# Patient Record
Sex: Male | Born: 2000 | Race: White | Hispanic: No | Marital: Single | Smoking: Never smoker
Health system: Southern US, Community
[De-identification: ages and names within clinical notes are randomized; demographics above are authoritative.]

## PROBLEM LIST (undated history)

## (undated) DIAGNOSIS — J45909 Unspecified asthma, uncomplicated: Secondary | ICD-10-CM

---

## 2019-07-21 ENCOUNTER — Other Ambulatory Visit: Payer: Self-pay | Admitting: Orthopedic Surgery

## 2019-07-21 DIAGNOSIS — M25511 Pain in right shoulder: Secondary | ICD-10-CM

## 2019-07-29 ENCOUNTER — Encounter: Payer: Self-pay | Admitting: Emergency Medicine

## 2019-07-29 ENCOUNTER — Emergency Department
Admission: EM | Admit: 2019-07-29 | Discharge: 2019-07-29 | Disposition: A | Discharge status: Home / Self Care | Payer: Managed Care, Other (non HMO) | Attending: Student in an Organized Health Care Education/Training Program | Admitting: Student in an Organized Health Care Education/Training Program

## 2019-07-29 ENCOUNTER — Other Ambulatory Visit: Payer: Self-pay

## 2019-07-29 ENCOUNTER — Emergency Department: Payer: Managed Care, Other (non HMO)

## 2019-07-29 DIAGNOSIS — U071 COVID-19: Secondary | ICD-10-CM | POA: Insufficient documentation

## 2019-07-29 DIAGNOSIS — Z79899 Other long term (current) drug therapy: Secondary | ICD-10-CM | POA: Diagnosis not present

## 2019-07-29 DIAGNOSIS — J209 Acute bronchitis, unspecified: Secondary | ICD-10-CM

## 2019-07-29 DIAGNOSIS — J45901 Unspecified asthma with (acute) exacerbation: Secondary | ICD-10-CM | POA: Diagnosis not present

## 2019-07-29 DIAGNOSIS — R05 Cough: Secondary | ICD-10-CM | POA: Diagnosis present

## 2019-07-29 HISTORY — DX: Unspecified asthma, uncomplicated: J45.909

## 2019-07-29 MED ORDER — AZITHROMYCIN 250 MG PO TABS: 250.0000 mg | ORAL_TABLET | Freq: Every day | ORAL | 0 refills | Status: AC

## 2019-07-29 MED ORDER — ONDANSETRON 4 MG PO TBDP: ORAL_TABLET | ORAL | 0 refills | Status: DC

## 2019-07-29 MED ORDER — AZITHROMYCIN 500 MG PO TABS
500.0000 mg | ORAL_TABLET | Freq: Once | ORAL | Status: AC
  Administered 2019-07-29: 500 mg via ORAL
  Filled 2019-07-29: qty 1

## 2019-07-29 MED ORDER — PREDNISONE 20 MG PO TABS: ORAL_TABLET | ORAL | 0 refills | Status: DC

## 2019-07-29 MED ORDER — BENZONATATE 100 MG PO CAPS: ORAL_CAPSULE | ORAL | 0 refills | Status: DC

## 2019-07-29 MED ORDER — PREDNISONE 20 MG PO TABS
60.0000 mg | ORAL_TABLET | Freq: Once | ORAL | Status: AC
  Administered 2019-07-29: 60 mg via ORAL
  Filled 2019-07-29: qty 3

## 2019-07-29 NOTE — ED Triage Notes (Signed)
FIRST NURSE NOTE: Pt here COVID + told to come to ED for CXR and steroids.

## 2019-07-29 NOTE — ED Provider Notes (Signed)
[facility] Emergency Department Provider Note ____________________________________________  Time seen: 2208  I have reviewed the triage vital signs and the nursing notes.  HISTORY  Chief Complaint  Cough and Asthma  HPI Jorge Price is a 19 y.o. male presents to the ED for evaluation of symptoms related to recent Covid confirmation.  Patient is a Consulting civil engineer at [employer], and reports his last weekly test was positive.  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  He takes Qvar regularly and albuterol as needed.  He reports generalized fatigue, but denies any change in taste or smell sensation.  He denies any frank chest pain or shortness of breath,  with complaints of a harsh barky cough.   Past Medical History:  Diagnosis Date  . Asthma     There are no problems to display for this patient.   History reviewed. No pertinent surgical history.  Prior to Admission medications   Medication Sig Start Date End Date Taking? Authorizing Provider  albuterol (VENTOLIN HFA) 108 (90 Base) MCG/ACT inhaler Inhale 2 puffs into the lungs every 6 (six) hours as needed for wheezing or shortness of breath.   Yes [provider]  beclomethasone (QVAR) 80 MCG/ACT inhaler Inhale 2 puffs into the lungs 2 (two) times daily.   Yes [provider]  doxycycline (VIBRA-TABS) 100 MG tablet Take 100 mg by mouth 2 (two) times daily.   Yes [provider]    Allergies Patient has no known allergies.  History reviewed. No pertinent family history.  Social History Social History   Tobacco Use  . Smoking status: Never Smoker  . Smokeless tobacco: Never Used  Substance Use Topics  . Alcohol use: Yes  . Drug use: Never    Review of Systems  Constitutional: Negative for fever. Eyes: Negative for visual changes. ENT: Negative for sore throat. Cardiovascular: Negative for chest pain. Respiratory: Negative for shortness of breath.   Reports cough as above. Gastrointestinal: Negative for abdominal pain, vomiting and diarrhea. Genitourinary: Negative for dysuria. Musculoskeletal: Negative for back pain.  Reports generalized body aches. Skin: Negative for rash. Neurological: Negative for headaches, focal weakness or numbness. ____________________________________________  PHYSICAL EXAM:  VITAL SIGNS: ED Triage Vitals  Enc Vitals Group     BP 07/29/19 2109 (!) 154/114     Pulse Rate 07/29/19 2109 100     Resp 07/29/19 2109 18     Temp 07/29/19 2109 99.1 F (37.3 C)     Temp Source 07/29/19 2109 Oral     SpO2 07/29/19 2109 98 %     Weight 07/29/19 2110 150 lb (68 kg)     Height 07/29/19 2110 5\' 10"  (1.778 m)     Head Circumference --      Peak Flow --      Pain Score 07/29/19 2118 5     Pain Loc --      Pain Edu? --      Excl. in GC? --     Constitutional: Alert and oriented. Well appearing and in no distress. Head: Normocephalic and atraumatic. Eyes: Conjunctivae are normal. Normal extraocular movements Cardiovascular: Normal rate, regular rhythm. Normal distal pulses. Respiratory: Normal respiratory effort. No wheezes/rales/rhonchi. Gastrointestinal: Soft and nontender. No distention. Musculoskeletal: Nontender with normal range of motion in all extremities.  Neurologic:  Normal gait without ataxia. Normal speech and language. No gross focal neurologic deficits are appreciated. Skin:  Skin is warm, dry and intact. No rash noted. Psychiatric: Mood and affect are normal.  Patient exhibits appropriate insight and judgment. ____________________________________________   RADIOLOGY  CXR 1V  Negative  I, Anai Lipson V Bacon-Maxi Carreras, personally viewed and evaluated these images (plain radiographs) as part of my medical decision making, as well as reviewing the written report by the radiologist. ____________________________________________  PROCEDURES  Prednisone 60 mg PO Azithromycin 500 mg  PO  Procedures ____________________________________________  INITIAL IMPRESSION / ASSESSMENT AND PLAN / ED COURSE  Patient with ED evaluation of symptoms related to recent Covid confirmation.  Patient reports a barky cough and increased use of his rescue inhaler.  He will be treated for symptomatic Covid with prescription for prednisone taper, and a azithromycin.  He will also be given Tessalon Perles for cough relief.  He is encouraged to follow-up with [facility] for ongoing symptom management, or return to the ED as necessary.  Jorge Price was evaluated in Emergency Department on 07/29/2019 for the symptoms described in the history of present illness. He was evaluated in the context of the global COVID-19 pandemic, which necessitated consideration that the patient might be at risk for infection with the SARS-CoV-2 virus that causes COVID-19. Institutional protocols and algorithms that pertain to the evaluation of patients at risk for COVID-19 are in a state of rapid change based on information released by regulatory bodies including the CDC and federal and state organizations. These policies and algorithms were followed during the patient's care in the ED. ____________________________________________  FINAL CLINICAL IMPRESSION(S) / ED DIAGNOSES  Final diagnoses:  COVID-19 virus infection  Acute bronchitis, unspecified organism      Carmie End, Dannielle Karvonen, PA-C 07/29/19 2234    Merlyn Lot, MD 07/29/19 2241

## 2019-07-29 NOTE — Discharge Instructions (Signed)
Your exam and CXR are stable and normal at this time. Take the prescription meds as directed. Continue to use your inhalers as prescribed. You may take OTC Delsym cough syrup for additional cough relief. Return to the ED as needed.

## 2019-07-29 NOTE — ED Notes (Signed)
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

## 2019-07-29 NOTE — ED Triage Notes (Signed)
Pt presents today with positive COVID test today at [facility]. Students are tested weekly but he tested yesterday but went back again today due to worsening barking cough. Hx of the same annually around this time of year and has "cold induced asthma". Pt was told to come to ED by his PCP and get a chest xray and steroid and oxygen saturation.  Pt alert and calm at this time. Frequent cough present. No acute distress noted.

## 2019-08-01 ENCOUNTER — Ambulatory Visit: Payer: Managed Care, Other (non HMO)

## 2019-08-11 ENCOUNTER — Ambulatory Visit
Admission: RE | Admit: 2019-08-11 | Discharge: 2019-08-11 | Disposition: A | Discharge status: Home / Self Care | Payer: Managed Care, Other (non HMO) | Source: Ambulatory Visit | Attending: Orthopedic Surgery | Admitting: Orthopedic Surgery

## 2019-08-11 ENCOUNTER — Other Ambulatory Visit: Payer: Self-pay

## 2019-08-11 DIAGNOSIS — M25511 Pain in right shoulder: Secondary | ICD-10-CM | POA: Insufficient documentation

## 2019-08-11 MED ORDER — GADOBUTROL 1 MMOL/ML IV SOLN
0.0100 mL | Freq: Once | INTRAVENOUS | Status: AC | PRN
  Administered 2019-08-11: 17:00:00 0.01 mL

## 2019-08-11 MED ORDER — IOHEXOL 180 MG/ML  SOLN
7.0000 mL | Freq: Once | INTRAMUSCULAR | Status: AC | PRN
  Administered 2019-08-11: 7 mL

## 2019-08-11 MED ORDER — LIDOCAINE HCL (PF) 1 % IJ SOLN
7.0000 mL | Freq: Once | INTRAMUSCULAR | Status: AC
  Administered 2019-08-11: 17:00:00 7 mL
  Filled 2019-08-11: qty 8

## 2019-08-11 MED ORDER — SODIUM CHLORIDE (PF) 0.9 % IJ SOLN
15.0000 mL | Freq: Once | INTRAMUSCULAR | Status: AC
  Administered 2019-08-11: 15 mL

## 2020-07-09 ENCOUNTER — Other Ambulatory Visit (HOSPITAL_COMMUNITY): Payer: Self-pay | Admitting: Urology

## 2020-07-12 ENCOUNTER — Other Ambulatory Visit: Payer: Self-pay | Admitting: Sports Medicine

## 2020-07-12 ENCOUNTER — Other Ambulatory Visit: Payer: Self-pay

## 2020-07-12 DIAGNOSIS — M542 Cervicalgia: Secondary | ICD-10-CM

## 2020-07-12 DIAGNOSIS — M5412 Radiculopathy, cervical region: Secondary | ICD-10-CM

## 2020-07-12 DIAGNOSIS — M79601 Pain in right arm: Secondary | ICD-10-CM

## 2020-07-18 ENCOUNTER — Ambulatory Visit: Payer: Managed Care, Other (non HMO)

## 2020-07-27 ENCOUNTER — Ambulatory Visit: Payer: Managed Care, Other (non HMO)

## 2021-10-23 IMAGING — MR MR SHOULDER*R* W/CM
6 series · 40 of 40 positions shown · IV contrast (agent unspecified)
Comparison: None.

CLINICAL DATA: Shoulder pain, started 05/02/2019

EXAM:
MR ARTHROGRAM OF THE right SHOULDER
TECHNIQUE: Multiplanar, multisequence MR imaging of the right shoulder was
performed following the administration of intra-articular contrast.
CONTRAST:  See Injection Documentation.

[Series 5: T1 fat-sat · axial · right · 4.0mm · 0.55mm/px · z∈[-125,-13]mm · 6 of 25 slices shown (1 of 3)]
[im 1/25]
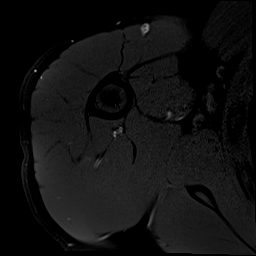
[im 5/25]
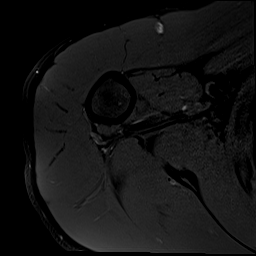
[im 10/25]
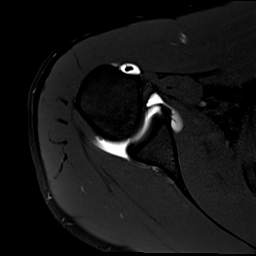
[im 15/25]
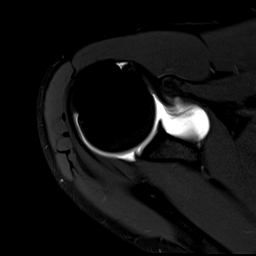
[im 20/25]
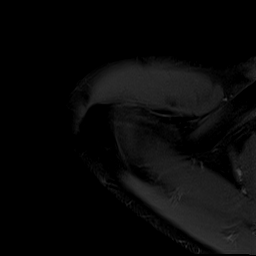
[im 25/25]
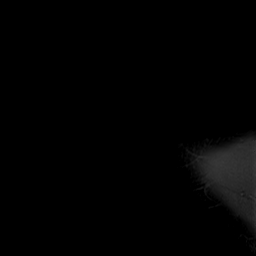

[Series 7: T1 fat-sat · oblique · right · 4.0mm · 0.55mm/px · 6 of 25 slices shown (2 of 3)]
[im 1/25]
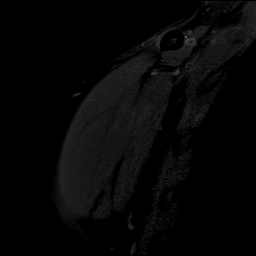
[im 5/25]
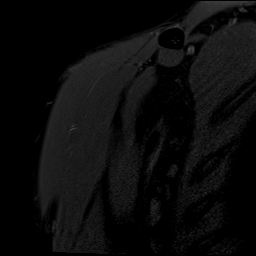
[im 10/25]
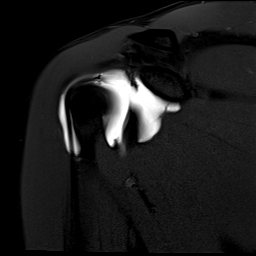
[im 15/25]
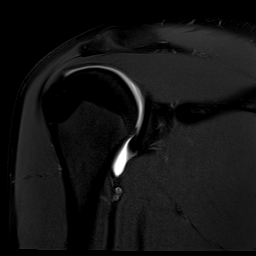
[im 20/25]
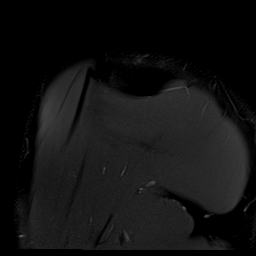
[im 25/25]
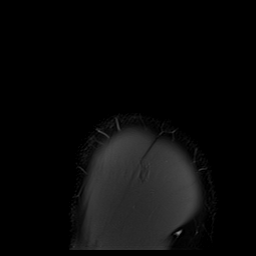

[Series 8: T2 fat-sat · oblique · right · 4.0mm · 0.55mm/px · 7 of 26 slices shown (1 of 2)]
[im 1/26]
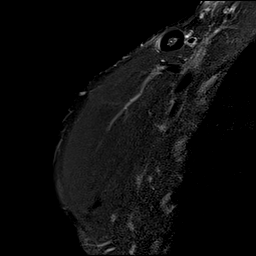
[im 5/26]
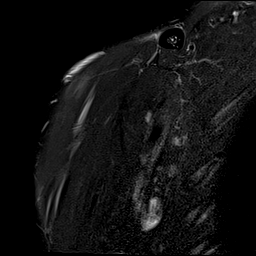
[im 9/26]
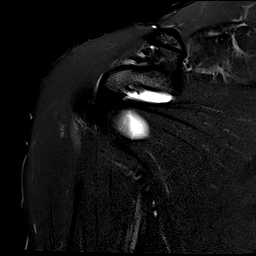
[im 13/26]
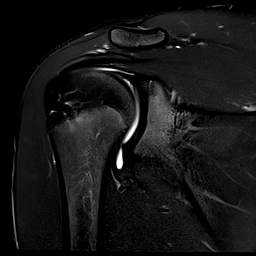
[im 17/26]
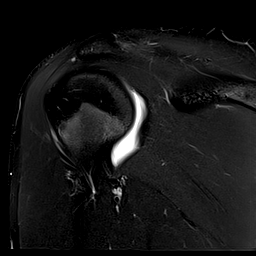
[im 21/26]
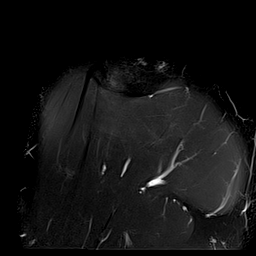
[im 26/26]
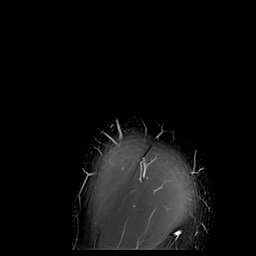

[Series 9: T1 · oblique · right · 4.0mm · 0.51mm/px · 7 of 26 slices shown]
[im 1/26]
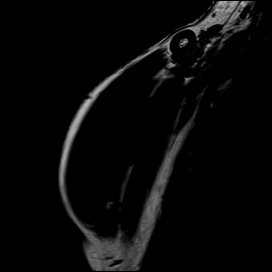
[im 5/26]
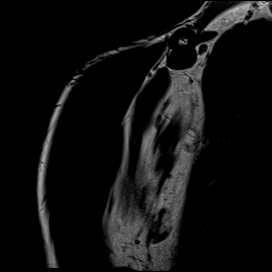
[im 9/26]
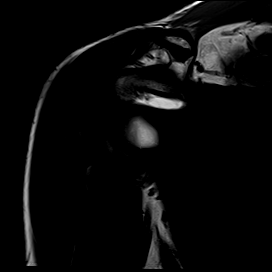
[im 13/26]
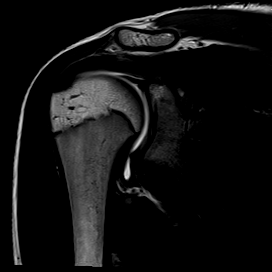
[im 17/26]
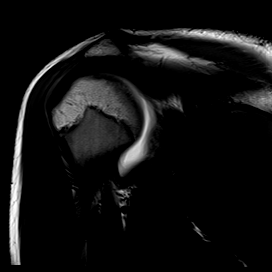
[im 21/26]
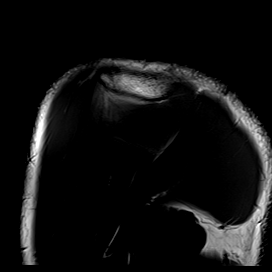
[im 26/26]
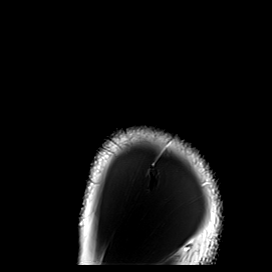

[Series 10: T2 fat-sat · oblique · right · 4.0mm · 0.55mm/px · 7 of 25 slices shown (2 of 2)]
[im 1/25]
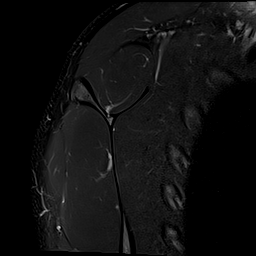
[im 5/25]
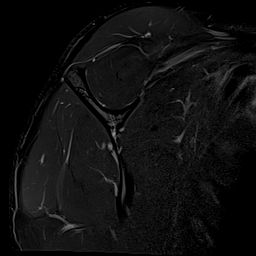
[im 9/25]
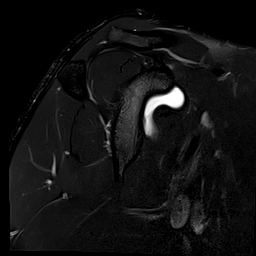
[im 13/25]
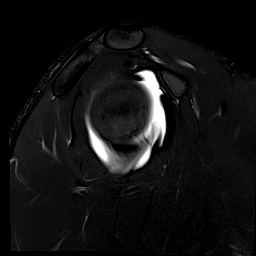
[im 17/25]
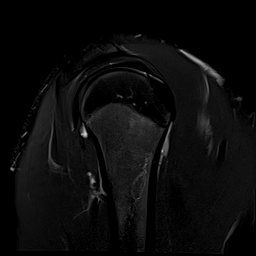
[im 21/25]
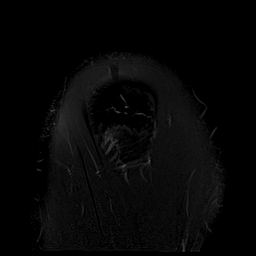
[im 25/25]
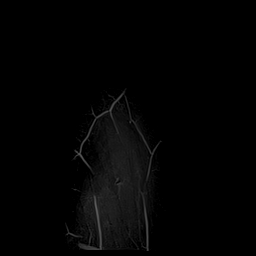

[Series 12: T1 fat-sat · sagittal · right · 4.0mm · 0.62mm/px · 7 of 26 slices shown (3 of 3)]
[im 1/26]
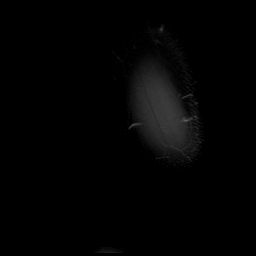
[im 5/26]
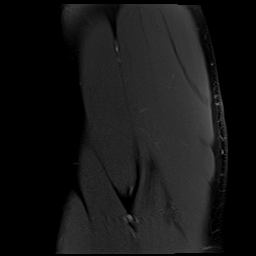
[im 9/26]
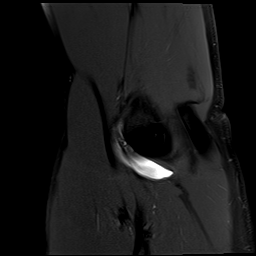
[im 13/26]
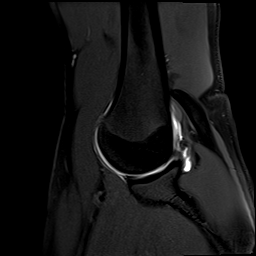
[im 17/26]
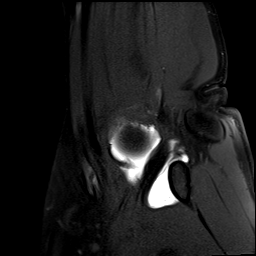
[im 21/26]
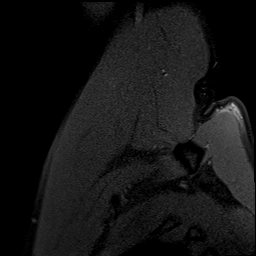
[im 26/26]
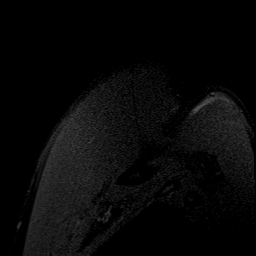

[40 of 40 positions shown; findings below may reference images not displayed]

FINDINGS: Rotator cuff: The supraspinatus and infraspinatus tendons are intact
without evidence of tendinopathy or tear. The subscapularis, and
teres minor tendons are intact . The muscles of the rotator cuff are
normal without tear, edema, or atrophy.

Muscles: The muscles other than the rotator cuff are normal without
tear, edema, or atrophy.

Biceps Long Head: The Intraarticular and extraarticular portions of
the biceps tendon are normal in position, size and signal.

Acromioclavicular Joint: The acromioclavicular joint is intact. Type
II acromion.

Glenohumeral Joint: The glenohumeral joint alignment is well
maintained. The humeral head and glenoid articular cartilage are
without focal defect or significant thinning. No joint effusion.

Labrum: The glenoid labrum is grossly intact without evidence large
tear or detachment. However the evaluation is limited by lack of
intraarticular fluid.

Bones: No fracture, osteonecrosis, or pathologic marrow
infiltration.

Other: The subacromial-subdeltoid bursa is normal without evidence
of bursitis.
IMPRESSION: Normal MRI of the shoulder

## 2021-10-23 IMAGING — RF DG FLUORO GUIDE NDL PLC/BX
3 series · 8 of 8 positions shown · non-contrast
Comparison: none

CLINICAL DATA: Right shoulder pain 4 months

EXAM:
RIGHT SHOULDER INJECTION UNDER FLUOROSCOPY
TECHNIQUE: After a thorough discussion of risks and benefits of the procedure,
written and oral informed consent was obtained.

[Series 1: cp_standard · 0.18mm/px · 4 of 22 frames shown (1 of 3)]
[frame 4/22]
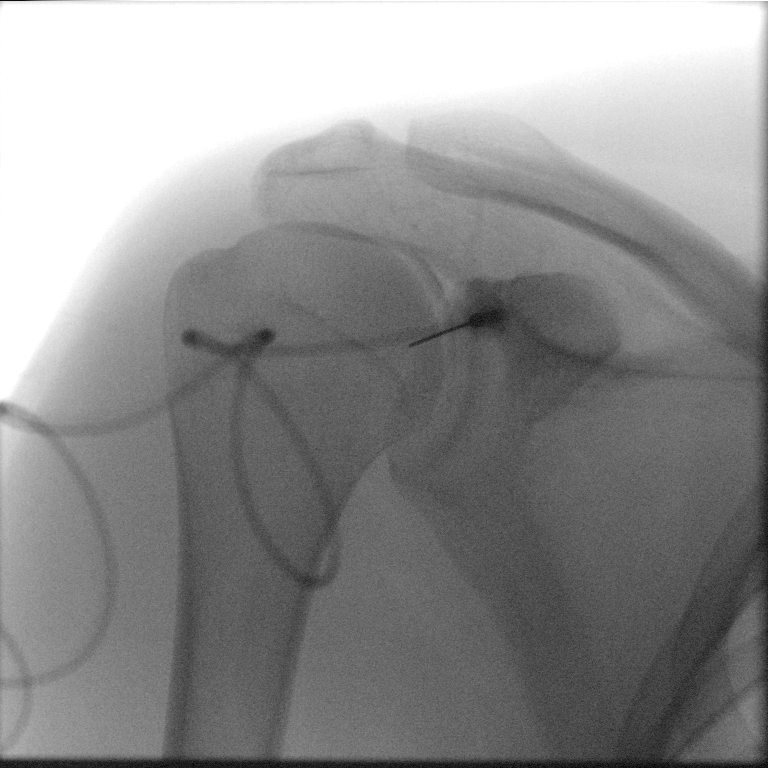
[frame 12/22]
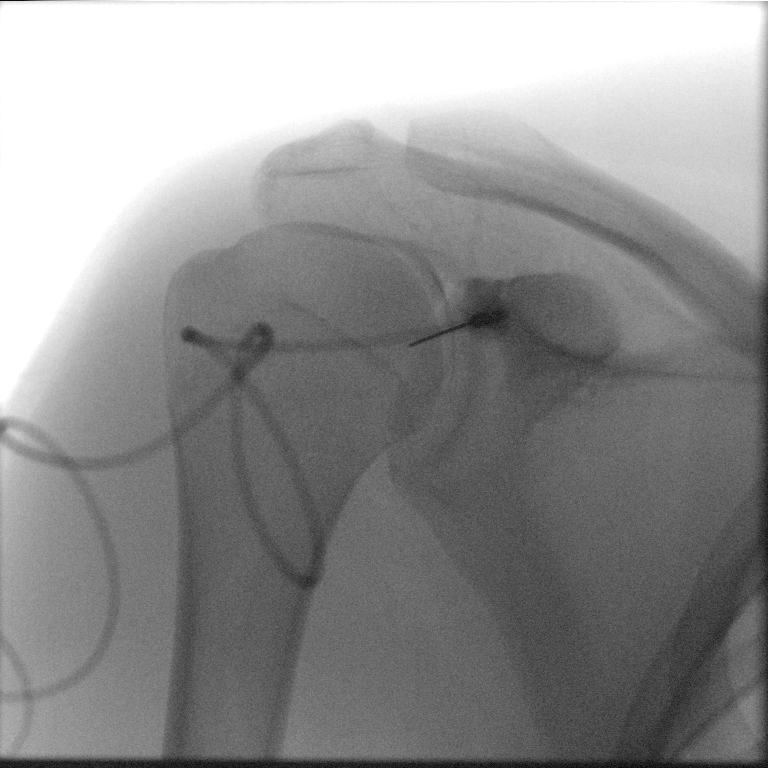
[frame 18/22]
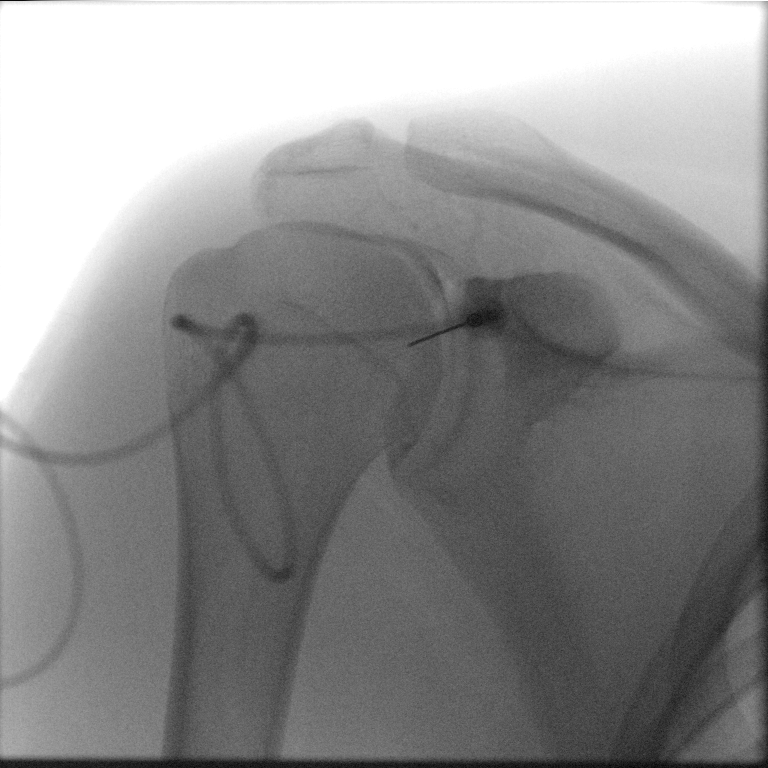
[frame 19/22]
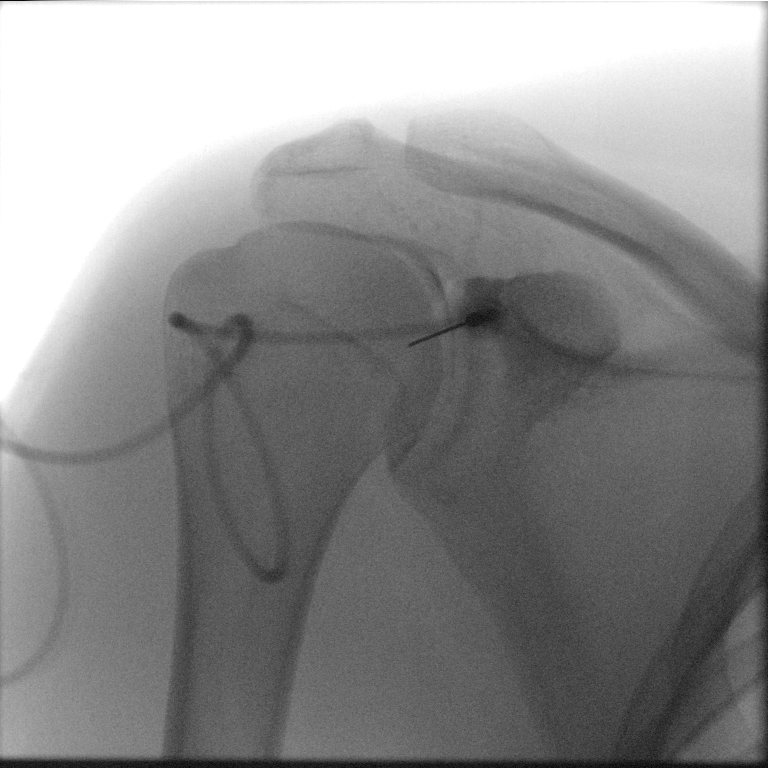

[Series 2: cp_standard · 0.18mm/px · 1 of 1 slices shown (2 of 3)]
[im 1/1]
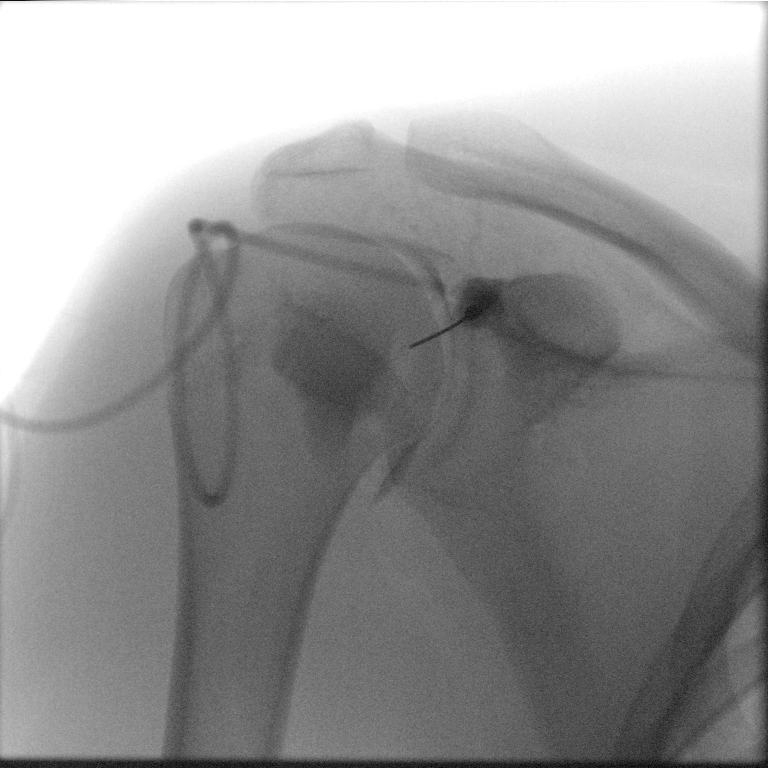

[Series 3: cp_standard · 0.18mm/px · 3 of 7 frames shown (3 of 3)]
[frame 2/7]
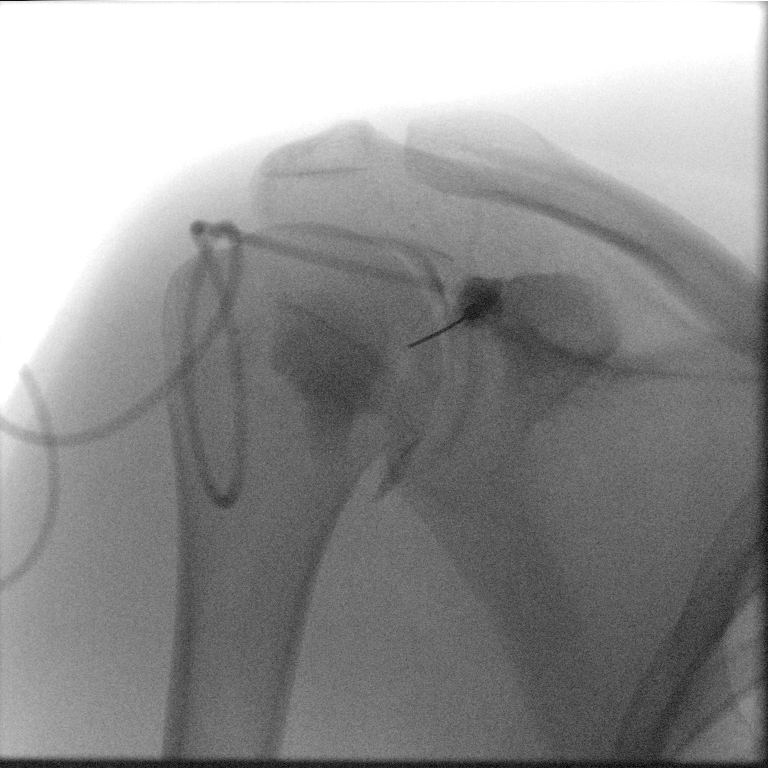
[frame 4/7]
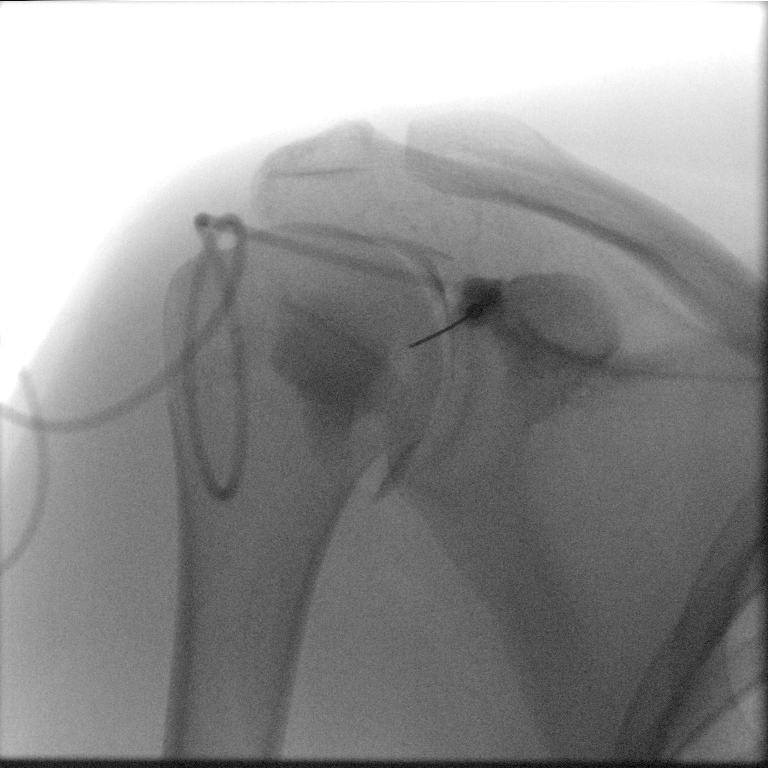
[frame 6/7]
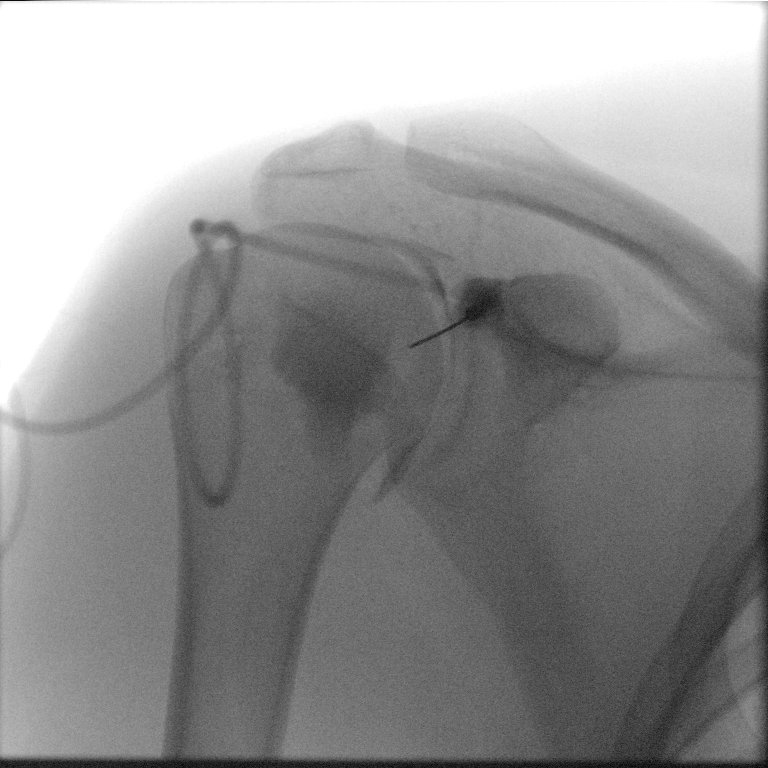

[8 of 8 positions shown; findings below may reference images not displayed]

Preliminary localization was performed over the right shoulder. The
area was marked over superior medial anterior humeral head.

After prep and drape in the usual sterile fashion, the skin and
deeper subcutaneous tissues were anesthetized with 1% Lidocaine
without Epinephrine. Under fluoroscopic guidance, a 22 gauge
inch spinal needle was advanced into the joint over the superior
medial anterior humeral head using an anterior approach.
Intra-articular injection of Lidocaine was performed which flowed
freely and subsequently the joint was distended with 10 ml of a
[DATE] dilution of Gadavist contrast. The MR arthrogram solution was
as follows: 7 mL Mmnipaque-2DA contrast agent, 0.1 mL Gadavist, 13
mL sterile saline. An end point was felt and the injection was
discontinued, the needle removed, and a sterile dressing applied.
The patient was taken to MRI for subsequent imaging.

The patient tolerated the procedure well and there were no
complications.

FLUOROSCOPY TIME:  Fluoroscopy Time:  12 seconds

Radiation Exposure Index (if provided by the fluoroscopic device):
1.2 mGy

Number of Acquired Spot Images: 0 full exposures
IMPRESSION: Technically successful right shoulder injection for MRI.

## 2022-02-08 ENCOUNTER — Ambulatory Visit: Payer: Managed Care, Other (non HMO) | Admitting: Medical

## 2022-05-14 ENCOUNTER — Other Ambulatory Visit: Payer: Self-pay

## 2022-05-14 ENCOUNTER — Ambulatory Visit (INDEPENDENT_AMBULATORY_CARE_PROVIDER_SITE_OTHER): Payer: Managed Care, Other (non HMO) | Admitting: Family

## 2022-05-14 VITALS — BP 122/74 | HR 83 | Temp 99.6°F | Wt 167.0 lb

## 2022-05-14 DIAGNOSIS — J0300 Acute streptococcal tonsillitis, unspecified: Secondary | ICD-10-CM | POA: Diagnosis not present

## 2022-05-14 DIAGNOSIS — J039 Acute tonsillitis, unspecified: Secondary | ICD-10-CM | POA: Diagnosis not present

## 2022-05-14 LAB — POC SOFIA 2 FLU + SARS ANTIGEN FIA
Influenza A, POC: NEGATIVE
Influenza B, POC: NEGATIVE
SARS Coronavirus 2 Ag: NEGATIVE

## 2022-05-14 LAB — POCT RAPID STREP A (OFFICE): Rapid Strep A Screen: POSITIVE — AB

## 2022-05-14 MED ORDER — PENICILLIN V POTASSIUM 500 MG PO TABS: 500.0000 mg | ORAL_TABLET | Freq: Three times a day (TID) | ORAL | 0 refills | Status: AC

## 2022-05-14 NOTE — Progress Notes (Signed)
[facility] [address] [place], [place] [address] Phone: [phone] Fax: [phone]   Office Visit Note  Patient Name: Jorge Price  Date of Birth:Oct 10, 2000  Med Rec number [id number]  Date of Service: 05/14/2022  Patient has no known allergies.  Chief Complaint  Patient presents with   Sick     HPI Pt presents with c/o fever that started yesterday.  Otc Tylenol with some help.  Slept most of yesterday.  Bad sore throat.  Mild cough.  Hx of asthma.    Current Medication:  Outpatient Encounter Medications as of 05/14/2022  Medication Sig   penicillin v potassium (VEETID) 500 MG tablet Take 1 tablet (500 mg total) by mouth 3 (three) times daily for 10 days.   albuterol (VENTOLIN HFA) 108 (90 Base) MCG/ACT inhaler Inhale 2 puffs into the lungs every 6 (six) hours as needed for wheezing or shortness of breath.   [DISCONTINUED] beclomethasone (QVAR) 80 MCG/ACT inhaler Inhale 2 puffs into the lungs 2 (two) times daily.   [DISCONTINUED] benzonatate (TESSALON PERLES) 100 MG capsule Take 1-2 tabs TID prn cough   [DISCONTINUED] doxycycline (VIBRA-TABS) 100 MG tablet Take 100 mg by mouth 2 (two) times daily.   [DISCONTINUED] ondansetron (ZOFRAN ODT) 4 MG disintegrating tablet Allow 1-2 tablets to dissolve in your mouth every 8 hours as needed for nausea/vomiting   [DISCONTINUED] predniSONE (DELTASONE) 20 MG tablet Take 3 tabs daily x 2 days; Take 2 tabs daily x 3 days; Take 1 tab daily x 3 days; Take 0.5 tab daily x 4 days   No facility-administered encounter medications on file as of 05/14/2022.      Medical History: Past Medical History:  Diagnosis Date   Asthma      Vital Signs: BP 122/74   Pulse 83   Temp 99.6 F (37.6 C)   Wt 167 lb (75.8 kg)   SpO2 98%   BMI 23.96 kg/m    Review of Systems  Constitutional:  Positive for activity change, chills, fatigue and fever.  HENT:  Positive for sore throat. Negative for ear pain, sinus pressure and sinus  pain.   Respiratory: Negative.      Physical Exam Constitutional:      Appearance: Normal appearance.  HENT:     Right Ear: Tympanic membrane normal.     Left Ear: Tympanic membrane normal.     Nose: Nose normal.     Mouth/Throat:     Pharynx: Oropharyngeal exudate and posterior oropharyngeal erythema present.     Comments: Moderate swelling to pharynx and tonsils, no uvular deviation Cardiovascular:     Rate and Rhythm: Normal rate and regular rhythm.     Heart sounds: Normal heart sounds.  Pulmonary:     Effort: Pulmonary effort is normal.     Breath sounds: Normal breath sounds.  Lymphadenopathy:     Cervical: Cervical adenopathy present.  Neurological:     Mental Status: He is alert.    Results for orders placed or performed in visit on 05/14/22 (from the past 24 hour(s))  POC  2 FLU + SARS ANTIGEN FIA     Status: Normal   Collection Time: 05/14/22 11:33 AM  Result Value Ref Range   Influenza A, POC Negative Negative   Influenza B, POC Negative Negative   SARS Coronavirus 2 Ag Negative Negative  POCT rapid strep A     Status: Abnormal   Collection Time: 05/14/22 11:38 AM  Result Value Ref Range   Rapid Strep  A Screen Positive (A) Negative       Assessment/Plan: 1. Tonsillitis  - POCT rapid strep A - penicillin v potassium (VEETID) 500 MG tablet; Take 1 tablet (500 mg total) by mouth 3 (three) times daily for 10 days.  Dispense: 30 tablet; Refill: 0 - POC  2 FLU + SARS ANTIGEN FIA  Hydrate and rest. Salt water gargles. Otc Advil and Tylenol as needed for comfort.  Take all 10 days of abx.  Follow up with pcp if no improvement in 5-7 days.  General Counseling: Jorge Price verbalizes understanding of the findings of todays visit and agrees with plan of treatment. I have discussed any further diagnostic evaluation that may be needed or ordered today. We also reviewed his medications today. he has been encouraged to call the office with any questions or concerns that  should arise related to todays visit.   Orders Placed This Encounter  Procedures   POCT rapid strep A   POC  2 FLU + SARS ANTIGEN FIA    Meds ordered this encounter  Medications   penicillin v potassium (VEETID) 500 MG tablet    Sig: Take 1 tablet (500 mg total) by mouth 3 (three) times daily for 10 days.    Dispense:  30 tablet    Refill:  0    Time spent: 20 Minutes Time spent includes review of chart, medications, test results, and follow up plan with the patient.    Janeece Agee, FNP-C Nurse Practitioner

## 2022-07-09 ENCOUNTER — Encounter: Payer: Self-pay | Admitting: Oncology

## 2022-07-09 ENCOUNTER — Other Ambulatory Visit: Payer: Self-pay

## 2022-07-09 ENCOUNTER — Ambulatory Visit (INDEPENDENT_AMBULATORY_CARE_PROVIDER_SITE_OTHER): Payer: Managed Care, Other (non HMO) | Admitting: Oncology

## 2022-07-09 VITALS — BP 136/84 | HR 117 | Temp 99.9°F | Wt 166.0 lb

## 2022-07-09 DIAGNOSIS — R112 Nausea with vomiting, unspecified: Secondary | ICD-10-CM

## 2022-07-09 DIAGNOSIS — R52 Pain, unspecified: Secondary | ICD-10-CM

## 2022-07-09 LAB — POC SOFIA 2 FLU + SARS ANTIGEN FIA
Influenza A, POC: NEGATIVE
Influenza B, POC: NEGATIVE
SARS Coronavirus 2 Ag: NEGATIVE

## 2022-07-09 MED ORDER — ONDANSETRON HCL 4 MG PO TABS: 4.0000 mg | ORAL_TABLET | Freq: Three times a day (TID) | ORAL | 0 refills | Status: DC | PRN

## 2022-07-09 MED ORDER — OSELTAMIVIR PHOSPHATE 75 MG PO CAPS: 75.0000 mg | ORAL_CAPSULE | Freq: Two times a day (BID) | ORAL | 0 refills | Status: DC

## 2022-07-09 NOTE — Progress Notes (Signed)
[address] [place], [place] [address] Phone: [phone] Fax: [phone]   Office Visit Note  Patient Name: Jorge Price  Date of EQAST:[id number]  Med Rec number [id number]  Date of Service: 07/09/2022  Patient has no known allergies.  Chief Complaint  Patient presents with   Cough   Emesis   Diarrhea   Headache   Chills   Generalized Body Aches   Patient is an 22 y.o. student here for complaints of nausea, headache, cough and diarrhea that started yesterday. Felt fine yesterday for most of the day. Wet surfing. Has a history anxiety. Has been unable to eat or drink. Had dinner last night. Has not been taking anything otc.   Has history of cold induced asthma.  Has an inhaler to use as needed.  Has not used this yet.  Current Medication:  Outpatient Encounter Medications as of 07/09/2022  Medication Sig   albuterol (VENTOLIN HFA) 108 (90 Base) MCG/ACT inhaler Inhale 2 puffs into the lungs every 6 (six) hours as needed for wheezing or shortness of breath.   sertraline (ZOLOFT) 50 MG tablet Take by mouth.   No facility-administered encounter medications on file as of 07/09/2022.      Medical History: Past Medical History:  Diagnosis Date   Asthma      Vital Signs: BP 136/84 (BP Location: Left Arm, Patient Position: Sitting)   Pulse (!) 117   Temp 99.9 F (37.7 C)   Wt 166 lb (75.3 kg)   SpO2 96%   BMI 23.82 kg/m   ROS: As per HPI.  All other pertinent ROS negative.     Review of Systems  Constitutional:  Positive for fatigue.  HENT:  Positive for congestion.   Respiratory:  Positive for cough.   Gastrointestinal:  Positive for nausea and vomiting. Negative for abdominal pain.  Musculoskeletal:  Positive for myalgias.  Neurological:  Negative for dizziness and headaches.    Physical Exam Constitutional:      Appearance: Normal appearance. He is well-developed.  HENT:     Right Ear: Tympanic membrane normal.     Left Ear:  Tympanic membrane normal.     Nose: No congestion.     Right Turbinates: Not swollen.     Left Turbinates: Not swollen.     Mouth/Throat:     Mouth: Mucous membranes are moist.     Pharynx: Pharyngeal swelling and posterior oropharyngeal erythema present.     Tonsils: No tonsillar exudate. 1+ on the right. 1+ on the left.  Pulmonary:     Effort: Pulmonary effort is normal.     Breath sounds: Normal breath sounds.  Lymphadenopathy:     Cervical: Cervical adenopathy present.  Neurological:     Mental Status: He is alert.     No results found for this or any previous visit (from the past 24 hour(s)).  Assessment/Plan: 1. Generalized body aches - Flu/Covid negative. -Discussed likely too early for positive flu test. Clinically suspicious for flu.  Discussed Potential benefit and adverse effects of Tamiflu.  Patient elected to take treatment.  Use inhaler as needed for cough.  Encourage supportive measures (fluids, rest cough drops, tea) and over the counter meds (I.e. analgesic/anti-inflammatory, decongestant, antihistamine, cough suppressant)  as needed for symptom relief.  Discussed infection control measures to prevent spread of infection.  Advised she should not return to class until afebrile (100.4 or above) for 24 hours.  Advised patient to send me a message in MyChart if  continue to have fever.  Return to clinic as needed for new/worsening symptoms (I.e. shortness of breath) or if symptoms are not resolving as expected over the next 5-7 days.   - POC SOFIA 2 FLU + SARS ANTIGEN FIA - oseltamivir (TAMIFLU) 75 MG capsule; Take 1 capsule (75 mg total) by mouth 2 (two) times daily.  Dispense: 10 capsule; Refill: 0  2. Nausea and vomiting, unspecified vomiting type -Has had Zofran in the past.  Recommend Zofran 15 minutes prior to trying to eat or take medication.  May take every 4-6 hours as needed for nausea and vomiting. - ondansetron (ZOFRAN) 4 MG tablet; Take 1 tablet (4 mg total) by  mouth every 8 (eight) hours as needed for nausea or vomiting.  Dispense: 20 tablet; Refill: 0   Disposition-return to clinic as needed  General Counseling: Osamah verbalizes understanding of the findings of todays visit and agrees with plan of treatment. I have discussed any further diagnostic evaluation that may be needed or ordered today. We also reviewed his medications today. he has been encouraged to call the office with any questions or concerns that should arise related to todays visit.   Orders Placed This Encounter  Procedures   POC SOFIA 2 FLU + SARS ANTIGEN FIA    No orders of the defined types were placed in this encounter.  I spent 20 minutes dedicated to the care of this patient (face-to-face and non-face-to-face) on the date of the encounter to include what is described in the assessment and plan.   Faythe Casa, NP 07/09/2022 11:48 AM

## 2022-07-27 ENCOUNTER — Encounter: Payer: Self-pay | Admitting: Medical

## 2022-07-27 ENCOUNTER — Ambulatory Visit: Payer: Managed Care, Other (non HMO) | Admitting: Medical

## 2022-07-27 ENCOUNTER — Other Ambulatory Visit: Payer: Self-pay

## 2022-07-27 VITALS — BP 110/68 | HR 108 | Temp 100.4°F | Ht 75.98 in | Wt 166.0 lb

## 2022-07-27 DIAGNOSIS — R509 Fever, unspecified: Secondary | ICD-10-CM | POA: Diagnosis not present

## 2022-07-27 DIAGNOSIS — R110 Nausea: Secondary | ICD-10-CM | POA: Diagnosis not present

## 2022-07-27 DIAGNOSIS — J039 Acute tonsillitis, unspecified: Secondary | ICD-10-CM | POA: Diagnosis not present

## 2022-07-27 LAB — POC SOFIA 2 FLU + SARS ANTIGEN FIA
Influenza A, POC: NEGATIVE
Influenza B, POC: NEGATIVE
SARS Coronavirus 2 Ag: NEGATIVE

## 2022-07-27 LAB — POCT RAPID STREP A (OFFICE): Rapid Strep A Screen: NEGATIVE

## 2022-07-27 MED ORDER — ONDANSETRON HCL 4 MG PO TABS: 4.0000 mg | ORAL_TABLET | Freq: Three times a day (TID) | ORAL | 0 refills | Status: AC | PRN

## 2022-07-27 NOTE — Progress Notes (Signed)
[facility] [place], [place] [address] Phone: [phone] Fax: [phone]   Office Visit Note  Patient Name: Jorge Price  Date of [id number]  Med Rec number [id number]  Date of Service: 07/27/2022  Allergies: Patient has no known allergies.  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   Sx began 2 days ago with nasal congestion and sore throat. Sx got worse yesterday. Has gotten more painful to swallow. Has had nausea and dry heaving. Not much cough. Subjective fever sx yesterday afternoon, temp 102 last night. Has runny nose, myalgias and HA. Some diarrhea last couple weeks.  Denies sick contacts. Seen 2.5 weeks ago with some flu like sx, negative for flu and COVID, given Tamiflu. Then given 6d of Prednisone when cough got worse, finished this about 10 days ago. Had strep in Dec. No hx of mono.  Nyquil last night, no meds today.    Current Medication:  Outpatient Encounter Medications as of 07/27/2022  Medication Sig   albuterol (VENTOLIN HFA) 108 (90 Base) MCG/ACT inhaler Inhale 2 puffs into the lungs every 6 (six) hours as needed for wheezing or shortness of breath.   ondansetron (ZOFRAN) 4 MG tablet Take 1 tablet (4 mg total) by mouth every 8 (eight) hours as needed for nausea or vomiting.   sertraline (ZOLOFT) 50 MG tablet Take by mouth.   [DISCONTINUED] oseltamivir (TAMIFLU) 75 MG capsule Take 1 capsule (75 mg total) by mouth 2 (two) times daily.   No facility-administered encounter medications on file as of 07/27/2022.      Medical History: Past Medical History:  Diagnosis Date   Asthma      Vital Signs: BP 110/68   Pulse (!) 108   Temp (!) 100.4 F (38 C) (Tympanic)   Ht 6' 3.98" (1.93 m)   Wt 166 lb (75.3 kg)   SpO2 98%   BMI 20.21 kg/m    Review of Systems See HPI  Physical Exam Vitals reviewed.  Constitutional:      General: He is  not in acute distress.    Appearance: He is ill-appearing (moderately).  HENT:     Head: Normocephalic.     Right Ear: Tympanic membrane, ear canal and external ear normal.     Left Ear: Tympanic membrane, ear canal and external ear normal.     Nose: Mucosal edema, congestion (moderate) and rhinorrhea present. Rhinorrhea is clear.     Right Turbinates: Swollen.     Left Turbinates: Swollen.     Mouth/Throat:     Mouth: Mucous membranes are moist. No oral lesions.     Pharynx: Uvula midline. Posterior oropharyngeal erythema (mild) present. No pharyngeal swelling or uvula swelling.     Tonsils: Tonsillar exudate (moderate white) present. 2+ on the right. 2+ on the left.  Cardiovascular:     Rate and Rhythm: Regular rhythm. Tachycardia present.     Heart sounds: No murmur heard.    No friction rub. No gallop.  Pulmonary:     Effort: Pulmonary effort is normal.     Breath sounds: Normal breath sounds. No wheezing, rhonchi or rales.  Musculoskeletal:     Cervical back: Neck supple. No rigidity.  Lymphadenopathy:     Cervical: Cervical adenopathy (1+ anterior and posterior cervical nodes, tender) present.  Neurological:     Mental Status: He is alert.    POCT rapid strep A  Status: Normal   Collection Time: 07/27/22 11:13 AM  Result Value Ref Range   Rapid Strep A Screen Negative Negative   POC SOFIA 2 FLU + SARS ANTIGEN FIA     Status: Normal   Collection Time: 07/27/22 11:23 AM  Result Value Ref Range   Influenza A, POC Negative Negative   Influenza B, POC Negative Negative   SARS Coronavirus 2 Ag Negative Negative    Assessment/Plan: 1. Tonsillitis 2. Fever, unspecified fever cause 3. Nausea Bacterial versus viral infection. Will check CBC with diff and EBV IgM. Gave Zofran to use in meantime for nausea. Discussed other supportive and symptomatic treatment.  - POC SOFIA 2 FLU + SARS ANTIGEN FIA - POCT rapid strep A - CBC with Differential/Platelet - Epstein-Barr  virus VCA, IgM - ondansetron (ZOFRAN) 4 MG tablet; Take 1 tablet (4 mg total) by mouth every 8 (eight) hours as needed for nausea or vomiting.  Dispense: 5 tablet; Refill: 0  Patient Instructions:  -Take an over-the-counter pain reliever/anti-inflammatory (such as ibuprofen or acetaminophen) to help relieve pain or fever.   -Use Ondansetron every 8 hours as needed for nausea or vomiting.   -Rest and drink plenty of water.  Drinking warm or cold liquids (such as tea, soup or smoothies) and eating soft foods (such as oatmeal) may be more comfortable until your throat pain improves.   -Do not share cups/water bottles/ utensils with others.  Wash your hands or use hand sanitizer often, especially before/after eating and after coughing into your hand or blowing your nose.    -You will receive a MyChart message notifying you of your lab results when they are available.  -Send MyChart message to provider or schedule return appointment in meantime as needed for new/worsening symptoms (such as increased throat pain or difficulty swallowing).  General Counseling: Jorge Price verbalizes understanding of the findings of todays visit and agrees with plan of treatment. he has been encouraged to call the office with any questions or concerns that should arise related to todays visit.  Time spent:20 [place] PA-C [place] 07/27/2022 10:36 AM  07/28/22 - CBC with diff shows leukocytosis with left shift. Antibiotic sent to pharmacy. Patient notified by MyChart message.

## 2022-07-28 LAB — CBC WITH DIFFERENTIAL/PLATELET
Basophils Absolute: 0 10*3/uL (ref 0.0–0.2)
Basos: 0 %
EOS (ABSOLUTE): 0.1 10*3/uL (ref 0.0–0.4)
Eos: 0 %
Hematocrit: 44.5 % (ref 37.5–51.0)
Hemoglobin: 15.2 g/dL (ref 13.0–17.7)
Immature Grans (Abs): 0.1 10*3/uL (ref 0.0–0.1)
Immature Granulocytes: 1 %
Lymphocytes Absolute: 1.6 10*3/uL (ref 0.7–3.1)
Lymphs: 10 %
MCH: 29.7 pg (ref 26.6–33.0)
MCHC: 34.2 g/dL (ref 31.5–35.7)
MCV: 87 fL (ref 79–97)
Monocytes Absolute: 1.8 10*3/uL — ABNORMAL HIGH (ref 0.1–0.9)
Monocytes: 11 %
Neutrophils Absolute: 12.6 10*3/uL — ABNORMAL HIGH (ref 1.4–7.0)
Neutrophils: 78 %
Platelets: 240 10*3/uL (ref 150–450)
RBC: 5.11 x10E6/uL (ref 4.14–5.80)
RDW: 13 % (ref 11.6–15.4)
WBC: 16.1 10*3/uL — ABNORMAL HIGH (ref 3.4–10.8)

## 2022-07-28 LAB — EPSTEIN-BARR VIRUS VCA, IGM: EBV VCA IgM: <36 U/mL (ref 0.0–35.9)

## 2022-07-28 MED ORDER — CEFDINIR 300 MG PO CAPS: 300.0000 mg | ORAL_CAPSULE | Freq: Two times a day (BID) | ORAL | 0 refills | Status: AC

## 2022-07-30 NOTE — Patient Instructions (Signed)
-  Take an over-the-counter pain reliever/anti-inflammatory (such as ibuprofen or acetaminophen) to help relieve pain or fever.   -Use Ondansetron every 8 hours as needed for nausea or vomiting.   -Rest and drink plenty of water.  Drinking warm or cold liquids (such as tea, soup or smoothies) and eating soft foods (such as oatmeal) may be more comfortable until your throat pain improves.   -Do not share cups/water bottles/ utensils with others.  Wash your hands or use hand sanitizer often, especially before/after eating and after coughing into your hand or blowing your nose.    -You will receive a MyChart message notifying you of your lab results when they are available.  -Send MyChart message to provider or schedule return appointment in meantime as needed for new/worsening symptoms (such as increased throat pain or difficulty swallowing).

## 2022-08-14 ENCOUNTER — Emergency Department
Admission: EM | Admit: 2022-08-14 | Discharge: 2022-08-14 | Disposition: A | Discharge status: Home / Self Care | Payer: Managed Care, Other (non HMO) | Attending: Emergency Medicine | Admitting: Emergency Medicine

## 2022-08-14 ENCOUNTER — Other Ambulatory Visit: Payer: Self-pay

## 2022-08-14 DIAGNOSIS — R000 Tachycardia, unspecified: Secondary | ICD-10-CM | POA: Insufficient documentation

## 2022-08-14 DIAGNOSIS — R112 Nausea with vomiting, unspecified: Secondary | ICD-10-CM | POA: Insufficient documentation

## 2022-08-14 DIAGNOSIS — R1033 Periumbilical pain: Secondary | ICD-10-CM | POA: Insufficient documentation

## 2022-08-14 DIAGNOSIS — R197 Diarrhea, unspecified: Secondary | ICD-10-CM | POA: Insufficient documentation

## 2022-08-14 DIAGNOSIS — D72829 Elevated white blood cell count, unspecified: Secondary | ICD-10-CM | POA: Insufficient documentation

## 2022-08-14 LAB — COMPREHENSIVE METABOLIC PANEL
ALT: 28 U/L (ref 0–44)
AST: 26 U/L (ref 15–41)
Albumin: 4.8 g/dL (ref 3.5–5.0)
Alkaline Phosphatase: 56 U/L (ref 38–126)
Anion gap: 17 — ABNORMAL HIGH (ref 5–15)
BUN: 18 mg/dL (ref 6–20)
CO2: 22 mmol/L (ref 22–32)
Calcium: 9.7 mg/dL (ref 8.9–10.3)
Chloride: 99 mmol/L (ref 98–111)
Creatinine, Ser: 1 mg/dL (ref 0.61–1.24)
GFR, Estimated: >60 mL/min (ref >60)
Glucose, Bld: 133 mg/dL — ABNORMAL HIGH (ref 70–99)
Potassium: 3.9 mmol/L (ref 3.5–5.1)
Sodium: 138 mmol/L (ref 135–145)
Total Bilirubin: 1.2 mg/dL (ref 0.3–1.2)
Total Protein: 7.7 g/dL (ref 6.5–8.1)

## 2022-08-14 LAB — URINALYSIS, ROUTINE W REFLEX MICROSCOPIC
Bilirubin Urine: NEGATIVE
Glucose, UA: NEGATIVE mg/dL
Hgb urine dipstick: NEGATIVE
Ketones, ur: 20 mg/dL — AB
Leukocytes,Ua: NEGATIVE
Nitrite: NEGATIVE
Protein, ur: NEGATIVE mg/dL
Specific Gravity, Urine: 1.024 (ref 1.005–1.030)
pH: 7 (ref 5.0–8.0)

## 2022-08-14 LAB — CBC
HCT: 45.7 % (ref 39.0–52.0)
Hemoglobin: 16.3 g/dL (ref 13.0–17.0)
MCH: 30.1 pg (ref 26.0–34.0)
MCHC: 35.7 g/dL (ref 30.0–36.0)
MCV: 84.3 fL (ref 80.0–100.0)
Platelets: 329 10*3/uL (ref 150–400)
RBC: 5.42 MIL/uL (ref 4.22–5.81)
RDW: 12.2 % (ref 11.5–15.5)
WBC: 14.8 10*3/uL — ABNORMAL HIGH (ref 4.0–10.5)
nRBC: 0 % (ref 0.0–0.2)

## 2022-08-14 LAB — ETHANOL: Alcohol, Ethyl (B): <10 mg/dL (ref <10)

## 2022-08-14 LAB — LIPASE, BLOOD: Lipase: 44 U/L (ref 11–51)

## 2022-08-14 MED ORDER — SODIUM CHLORIDE 0.9 % IV BOLUS
1000.0000 mL | Freq: Once | INTRAVENOUS | Status: AC
  Administered 2022-08-14: 1000 mL via INTRAVENOUS

## 2022-08-14 MED ORDER — ONDANSETRON HCL 4 MG/2ML IJ SOLN
4.0000 mg | Freq: Once | INTRAMUSCULAR | Status: AC | PRN
  Administered 2022-08-14: 4 mg via INTRAVENOUS
  Filled 2022-08-14: qty 2

## 2022-08-14 MED ORDER — METOCLOPRAMIDE HCL 5 MG/ML IJ SOLN
10.0000 mg | Freq: Once | INTRAMUSCULAR | Status: AC
  Administered 2022-08-14: 10 mg via INTRAVENOUS
  Filled 2022-08-14: qty 2

## 2022-08-14 MED ORDER — ONDANSETRON HCL 4 MG/2ML IJ SOLN
4.0000 mg | Freq: Once | INTRAMUSCULAR | Status: AC
  Administered 2022-08-14: 4 mg via INTRAVENOUS
  Filled 2022-08-14: qty 2

## 2022-08-14 MED ORDER — ONDANSETRON 4 MG PO TBDP: 4.0000 mg | ORAL_TABLET | Freq: Three times a day (TID) | ORAL | 0 refills | Status: AC | PRN

## 2022-08-14 NOTE — ED Triage Notes (Signed)
Pt to ED from home for nausea, vomiting and abdominal pain. Pt went to dinner tonight had a few beers. Came home and then woke up out of his direct sleep. He has vomited multiple times and is now throwing up bile.   Friend at bedside pt just pulled himself off of his prescribed zoloft as well with the direction of a doctor. Last date he took zoloft was Tuesday at dose of 25 mg.   Pt is CAOx and is in obvious discomfort in triage.

## 2022-08-14 NOTE — ED Notes (Signed)
Pt discharge to home. Pt VSS, GCS 15, NAD. Pt verbalized understanding of discharge instructions with no additional questions at this time.

## 2022-08-14 NOTE — ED Provider Notes (Signed)
[facility] Provider Note    Event Date/Time   First MD Initiated Contact with Patient 08/14/22 0354     (approximate)   History   Emesis   HPI  Jorge Price is a 22 y.o. male who presents to the emergency department today because of concerns for nausea vomiting and diarrhea.  The patient states that his symptoms started around 9:30 PM tonight.  He says that he does have history of IBS so does get diarrhea somewhat frequently although this episode was quite different.  He had multiple episodes of vomiting and watery diarrhea.  His vomit was consisting initially of the food he had eaten for dinner and then yellow emesis.  He has had some abdominal discomfort located in the center area with the symptoms.  He denies any fevers.  Denies any unusual ingestions or known sick contacts.     Physical Exam   Triage Vital Signs: ED Triage Vitals [08/14/22 0013]  Enc Vitals Group     BP 125/81     Pulse Rate (!) 115     Resp (!) 22     Temp 98.4 F (36.9 C)     Temp Source Oral     SpO2 98 %     Weight 165 lb 5.5 oz (75 kg)     Height '6\' 3"'$  (1.905 m)     Head Circumference      Peak Flow      Pain Score 0     Pain Loc      Pain Edu?      Excl. in [place]?     Most recent vital signs: Vitals:   08/14/22 0013  BP: 125/81  Pulse: (!) 115  Resp: (!) 22  Temp: 98.4 F (36.9 C)  SpO2: 98%   General: Awake, alert, oriented. CV:  Good peripheral perfusion. Tachycardia. Resp:  Normal effort. Lungs clear. Abd:  No distention. Minimal periumbilical tenderness.    ED Results / Procedures / Treatments   Labs (all labs ordered are listed, but only abnormal results are displayed) Labs Reviewed  COMPREHENSIVE METABOLIC PANEL - Abnormal; Notable for the following components:      Result Value   Glucose, Bld 133 (*)    Anion gap 17 (*)    All other components within normal limits  CBC - Abnormal; Notable for the following components:   WBC 14.8 (*)     All other components within normal limits  LIPASE, BLOOD  ETHANOL  URINALYSIS, ROUTINE W REFLEX MICROSCOPIC     EKG  None   RADIOLOGY None   PROCEDURES:  Critical Care performed: No   MEDICATIONS ORDERED IN ED: Medications  ondansetron (ZOFRAN) injection 4 mg (4 mg Intravenous Given 08/14/22 0024)     IMPRESSION / MDM / ASSESSMENT AND PLAN / ED COURSE  I reviewed the triage vital signs and the nursing notes.                              Differential diagnosis includes, but is not limited to, viral gastroenteritis, food poisoning, IBS, gallbladder disease, appendicitis  Patient's presentation is most consistent with acute presentation with potential threat to life or bodily function.  Patient presented to the urgency department today because of concerns for acute onset of nausea vomiting and diarrhea.  On exam patient without any active vomiting.  Mild tenderness in the periumbilical area.  No specific tenderness in the right  lower or right upper quadrants.  Patient was afebrile.  Blood work with a mild leukocytosis.  No significant electrolyte abnormality.  Some ketones in the urine.  Lipase negative.  I do think patient is somewhat dehydrated at this time.  Given lack of fever or concerning focal tenderness at this time I have lower concern for significant intra-abdominal infection.  At this time we will hold off on emergent imaging.  Will try symptomatically treating the patient with IV fluids and antiemetics.  I do think if patient feels better would be reasonable for patient to be discharged to follow-up with outpatient providers.   FINAL CLINICAL IMPRESSION(S) / ED DIAGNOSES   Final diagnoses:  Nausea vomiting and diarrhea     Note:  This document was prepared using Dragon voice recognition software and may include unintentional dictation errors.    Nance Pear, MD 08/14/22 [phone]

## 2022-08-14 NOTE — Discharge Instructions (Addendum)
Please seek medical attention for any high fevers, chest pain, shortness of breath, change in behavior, persistent vomiting, bloody stool or any other new or concerning symptoms.  

## 2022-08-14 NOTE — ED Provider Notes (Signed)
Reassessed patient he is resting comfortably.  Not having abdominal pain currently abdomen soft no tenderness in right lower quadrant.  He has been having some diarrhea no ongoing vomiting in the ED.  Tolerated p.o. challenge.  Will prescribe ODT Zofran.  Discussed return precautions.  He is appropriate to be discharged.   Rada Hay, MD 08/14/22 [id number]
